# Patient Record
Sex: Male | Born: 1955 | Race: White | Hispanic: No | Smoking: Light tobacco smoker
Health system: Southern US, Community
[De-identification: ages and names within clinical notes are randomized; demographics above are authoritative.]

## PROBLEM LIST (undated history)

## (undated) DIAGNOSIS — F419 Anxiety disorder, unspecified: Secondary | ICD-10-CM

## (undated) DIAGNOSIS — E7800 Pure hypercholesterolemia, unspecified: Secondary | ICD-10-CM

## (undated) HISTORY — PX: TRIGGER FINGER RELEASE: SHX641

## (undated) HISTORY — PX: VASECTOMY: SHX75

## (undated) HISTORY — PX: SHOULDER SURGERY: SHX246

---

## 2013-02-01 ENCOUNTER — Emergency Department (HOSPITAL_COMMUNITY)
Admission: EM | Admit: 2013-02-01 | Discharge: 2013-02-01 | Disposition: A | Discharge status: Home / Self Care | Payer: 59 | Attending: Emergency Medicine | Admitting: Emergency Medicine

## 2013-02-01 ENCOUNTER — Emergency Department (HOSPITAL_COMMUNITY): Payer: 59

## 2013-02-01 ENCOUNTER — Encounter (HOSPITAL_COMMUNITY): Payer: 59 | Admitting: Anesthesiology

## 2013-02-01 ENCOUNTER — Encounter (HOSPITAL_COMMUNITY)
Admission: EM | Disposition: A | Discharge status: Home / Self Care | Payer: Self-pay | Source: Home / Self Care | Attending: Emergency Medicine

## 2013-02-01 ENCOUNTER — Encounter (HOSPITAL_COMMUNITY): Payer: Self-pay | Admitting: Emergency Medicine

## 2013-02-01 ENCOUNTER — Emergency Department (HOSPITAL_COMMUNITY): Payer: 59 | Admitting: Anesthesiology

## 2013-02-01 DIAGNOSIS — F17200 Nicotine dependence, unspecified, uncomplicated: Secondary | ICD-10-CM | POA: Insufficient documentation

## 2013-02-01 DIAGNOSIS — IMO0002 Reserved for concepts with insufficient information to code with codable children: Secondary | ICD-10-CM | POA: Insufficient documentation

## 2013-02-01 DIAGNOSIS — W230XXA Caught, crushed, jammed, or pinched between moving objects, initial encounter: Secondary | ICD-10-CM | POA: Insufficient documentation

## 2013-02-01 DIAGNOSIS — E7800 Pure hypercholesterolemia, unspecified: Secondary | ICD-10-CM | POA: Insufficient documentation

## 2013-02-01 DIAGNOSIS — S6710XA Crushing injury of unspecified finger(s), initial encounter: Secondary | ICD-10-CM | POA: Insufficient documentation

## 2013-02-01 HISTORY — PX: AMPUTATION: SHX166

## 2013-02-01 HISTORY — DX: Pure hypercholesterolemia, unspecified: E78.00

## 2013-02-01 LAB — BASIC METABOLIC PANEL
CO2: 24 mEq/L (ref 19–32)
Calcium: 9.4 mg/dL (ref 8.4–10.5)
Chloride: 103 mEq/L (ref 96–112)
Creatinine, Ser: 1.07 mg/dL (ref 0.50–1.35)
Glucose, Bld: 110 mg/dL — ABNORMAL HIGH (ref 70–99)

## 2013-02-01 LAB — CBC
HCT: 43.8 % (ref 39.0–52.0)
Hemoglobin: 16 g/dL (ref 13.0–17.0)
MCH: 32.7 pg (ref 26.0–34.0)
MCV: 89.4 fL (ref 78.0–100.0)
Platelets: 223 10*3/uL (ref 150–400)
RBC: 4.9 MIL/uL (ref 4.22–5.81)
WBC: 11.2 10*3/uL — ABNORMAL HIGH (ref 4.0–10.5)

## 2013-02-01 SURGERY — AMPUTATION DIGIT
Anesthesia: General | Site: Hand | Laterality: Left | Wound class: Dirty or Infected

## 2013-02-01 MED ORDER — HYDROMORPHONE HCL PF 1 MG/ML IJ SOLN
1.0000 mg | Freq: Once | INTRAMUSCULAR | Status: AC
  Administered 2013-02-01: 1 mg via INTRAVENOUS
  Filled 2013-02-01: qty 1

## 2013-02-01 MED ORDER — BUPIVACAINE HCL (PF) 0.25 % IJ SOLN
INTRAMUSCULAR | Status: AC
  Filled 2013-02-01: qty 10

## 2013-02-01 MED ORDER — OXYCODONE-ACETAMINOPHEN 5-325 MG PO TABS: ORAL_TABLET | ORAL | Status: AC

## 2013-02-01 MED ORDER — OXYCODONE HCL 5 MG PO TABS
ORAL_TABLET | ORAL | Status: AC
  Filled 2013-02-01: qty 1

## 2013-02-01 MED ORDER — SULFAMETHOXAZOLE-TRIMETHOPRIM 800-160 MG PO TABS: 1.0000 | ORAL_TABLET | Freq: Two times a day (BID) | ORAL | Status: AC

## 2013-02-01 MED ORDER — MIDAZOLAM HCL 2 MG/2ML IJ SOLN: 0.5000 mg | Freq: Once | INTRAMUSCULAR | Status: DC | PRN

## 2013-02-01 MED ORDER — OXYCODONE HCL 5 MG PO TABS
5.0000 mg | ORAL_TABLET | Freq: Once | ORAL | Status: AC | PRN
  Administered 2013-02-01: 5 mg via ORAL

## 2013-02-01 MED ORDER — SODIUM CHLORIDE 0.9 % IR SOLN
Status: DC | PRN
  Administered 2013-02-01: 3000 mL

## 2013-02-01 MED ORDER — HYDROMORPHONE HCL PF 1 MG/ML IJ SOLN
0.2500 mg | INTRAMUSCULAR | Status: DC | PRN
Start: 2013-02-01 — End: 2013-02-02

## 2013-02-01 MED ORDER — PROMETHAZINE HCL 25 MG/ML IJ SOLN: 6.2500 mg | INTRAMUSCULAR | Status: DC | PRN

## 2013-02-01 MED ORDER — LIDOCAINE HCL (CARDIAC) 20 MG/ML IV SOLN
INTRAVENOUS | Status: DC | PRN
  Administered 2013-02-01: 50 mg via INTRAVENOUS

## 2013-02-01 MED ORDER — ONDANSETRON HCL 4 MG/2ML IJ SOLN
INTRAMUSCULAR | Status: DC | PRN
  Administered 2013-02-01: 4 mg via INTRAVENOUS

## 2013-02-01 MED ORDER — LACTATED RINGERS IV SOLN
INTRAVENOUS | Status: DC | PRN
  Administered 2013-02-01 (×2): via INTRAVENOUS

## 2013-02-01 MED ORDER — BUPIVACAINE HCL 0.25 % IJ SOLN
INTRAMUSCULAR | Status: DC | PRN
  Administered 2013-02-01: 20 mL

## 2013-02-01 MED ORDER — ONDANSETRON HCL 4 MG/2ML IJ SOLN
4.0000 mg | Freq: Once | INTRAMUSCULAR | Status: AC
  Administered 2013-02-01: 4 mg via INTRAVENOUS
  Filled 2013-02-01: qty 2

## 2013-02-01 MED ORDER — FENTANYL CITRATE 0.05 MG/ML IJ SOLN
INTRAMUSCULAR | Status: DC | PRN
  Administered 2013-02-01 (×3): 50 ug via INTRAVENOUS
  Administered 2013-02-01: 150 ug via INTRAVENOUS
  Administered 2013-02-01 (×2): 50 ug via INTRAVENOUS

## 2013-02-01 MED ORDER — PROPOFOL 10 MG/ML IV BOLUS
INTRAVENOUS | Status: DC | PRN
  Administered 2013-02-01: 200 mg via INTRAVENOUS

## 2013-02-01 MED ORDER — SCOPOLAMINE 1 MG/3DAYS TD PT72
MEDICATED_PATCH | TRANSDERMAL | Status: AC
  Administered 2013-02-01: 1 via TRANSDERMAL
  Filled 2013-02-01: qty 1

## 2013-02-01 MED ORDER — OXYCODONE HCL 5 MG/5ML PO SOLN: 5.0000 mg | Freq: Once | ORAL | Status: AC | PRN

## 2013-02-01 MED ORDER — MEPERIDINE HCL 25 MG/ML IJ SOLN: 6.2500 mg | INTRAMUSCULAR | Status: DC | PRN

## 2013-02-01 MED ORDER — MIDAZOLAM HCL 5 MG/5ML IJ SOLN
INTRAMUSCULAR | Status: DC | PRN
  Administered 2013-02-01: 2 mg via INTRAVENOUS

## 2013-02-01 MED ORDER — TETANUS-DIPHTH-ACELL PERTUSSIS 5-2.5-18.5 LF-MCG/0.5 IM SUSP
0.5000 mL | Freq: Once | INTRAMUSCULAR | Status: AC
  Administered 2013-02-01: 0.5 mL via INTRAMUSCULAR
  Filled 2013-02-01: qty 0.5

## 2013-02-01 MED ORDER — CEFAZOLIN SODIUM 1-5 GM-% IV SOLN
INTRAVENOUS | Status: AC
  Filled 2013-02-01: qty 100

## 2013-02-01 MED ORDER — CEFAZOLIN SODIUM 1-5 GM-% IV SOLN
1.0000 g | Freq: Once | INTRAVENOUS | Status: AC
  Administered 2013-02-01: 1 g via INTRAVENOUS
  Filled 2013-02-01: qty 50

## 2013-02-01 MED ORDER — CEFAZOLIN SODIUM-DEXTROSE 2-3 GM-% IV SOLR
INTRAVENOUS | Status: DC | PRN
  Administered 2013-02-01: 2 g via INTRAVENOUS

## 2013-02-01 SURGICAL SUPPLY — 42 items
BANDAGE ELASTIC 3 VELCRO ST LF (GAUZE/BANDAGES/DRESSINGS) ×2 IMPLANT
BANDAGE GAUZE ELAST BULKY 4 IN (GAUZE/BANDAGES/DRESSINGS) ×2 IMPLANT
BLADE LONG MED 31X9 (MISCELLANEOUS) ×2 IMPLANT
BNDG COHESIVE 3X5 TAN STRL LF (GAUZE/BANDAGES/DRESSINGS) IMPLANT
BNDG ESMARK 4X9 LF (GAUZE/BANDAGES/DRESSINGS) ×2 IMPLANT
CLOTH BEACON ORANGE TIMEOUT ST (SAFETY) ×2 IMPLANT
CORDS BIPOLAR (ELECTRODE) ×2 IMPLANT
CUFF TOURNIQUET SINGLE 18IN (TOURNIQUET CUFF) ×2 IMPLANT
CUFF TOURNIQUET SINGLE 24IN (TOURNIQUET CUFF) IMPLANT
DRSG KUZMA FLUFF (GAUZE/BANDAGES/DRESSINGS) IMPLANT
DURAPREP 26ML APPLICATOR (WOUND CARE) ×2 IMPLANT
GAUZE XEROFORM 1X8 LF (GAUZE/BANDAGES/DRESSINGS) ×2 IMPLANT
GLOVE BIO SURGEON STRL SZ 6.5 (GLOVE) ×2 IMPLANT
GLOVE BIO SURGEON STRL SZ7.5 (GLOVE) ×2 IMPLANT
GLOVE SURG ORTHO 8.0 STRL STRW (GLOVE) ×2 IMPLANT
GOWN BRE IMP PREV XXLGXLNG (GOWN DISPOSABLE) IMPLANT
GOWN PREVENTION PLUS XLARGE (GOWN DISPOSABLE) ×2 IMPLANT
GOWN STRL NON-REIN LRG LVL3 (GOWN DISPOSABLE) ×2 IMPLANT
KIT BASIN OR (CUSTOM PROCEDURE TRAY) ×2 IMPLANT
KIT ROOM TURNOVER OR (KITS) ×2 IMPLANT
NEEDLE HYPO 25GX1X1/2 BEV (NEEDLE) ×2 IMPLANT
NS IRRIG 1000ML POUR BTL (IV SOLUTION) ×2 IMPLANT
PACK ORTHO EXTREMITY (CUSTOM PROCEDURE TRAY) ×2 IMPLANT
PAD ARMBOARD 7.5X6 YLW CONV (MISCELLANEOUS) ×4 IMPLANT
PAD CAST 3X4 CTTN HI CHSV (CAST SUPPLIES) ×1 IMPLANT
PAD CAST 4YDX4 CTTN HI CHSV (CAST SUPPLIES) IMPLANT
PADDING CAST COTTON 3X4 STRL (CAST SUPPLIES) ×1
PADDING CAST COTTON 4X4 STRL (CAST SUPPLIES)
RUBBERBAND STERILE (MISCELLANEOUS) IMPLANT
SPLINT PLASTER EXTRA FAST 3X15 (CAST SUPPLIES) ×1
SPLINT PLASTER GYPS XFAST 3X15 (CAST SUPPLIES) ×1 IMPLANT
SPONGE GAUZE 4X4 12PLY (GAUZE/BANDAGES/DRESSINGS) ×2 IMPLANT
SPONGE SCRUB IODOPHOR (GAUZE/BANDAGES/DRESSINGS) ×2 IMPLANT
SUT ETHILON 5 0 PS 2 18 (SUTURE) IMPLANT
SUT MON AB 5-0 P3 18 (SUTURE) ×2 IMPLANT
SUT MON AB 5-0 PS2 18 (SUTURE) ×4 IMPLANT
SUT SILK 4 0 PS 2 (SUTURE) IMPLANT
SUT VICRYL 4-0 PS2 18IN ABS (SUTURE) IMPLANT
SYR CONTROL 10ML LL (SYRINGE) ×2 IMPLANT
TOWEL OR 17X24 6PK STRL BLUE (TOWEL DISPOSABLE) ×2 IMPLANT
TOWEL OR 17X26 10 PK STRL BLUE (TOWEL DISPOSABLE) ×2 IMPLANT
UNDERPAD 30X30 INCONTINENT (UNDERPADS AND DIAPERS) ×2 IMPLANT
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
## 2013-02-01 NOTE — Transfer of Care (Signed)
Immediate Anesthesia Transfer of Care Note  Patient: [name] [name]  Procedure(s) Performed: Procedure(s): REVISION AMPUTATION SECOND, THIRD AND FOURTH, and SMALL DIGITS LEFT HAND (Left)  Patient Location: PACU  Anesthesia Type:General  Level of Consciousness: awake, alert  and oriented  Airway & Oxygen Therapy: Patient Spontanous Breathing and Patient connected to nasal cannula oxygen  Post-op Assessment: Report given to PACU RN, Post -op Vital signs reviewed and stable and Patient moving all extremities X 4  Post vital signs: Reviewed and stable  Complications: No apparent anesthesia complications

## 2013-02-01 NOTE — Discharge Instructions (Signed)
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
## 2013-02-01 NOTE — ED Notes (Signed)
Patient transported to X-ray 

## 2013-02-01 NOTE — Anesthesia Postprocedure Evaluation (Signed)
  Anesthesia Post-op Note  Patient: [name] [name]  Procedure(s) Performed: Procedure(s): REVISION AMPUTATION SECOND, THIRD AND FOURTH, and SMALL DIGITS LEFT HAND (Left)  Patient Location: PACU  Anesthesia Type:General  Level of Consciousness: awake, alert , oriented and patient cooperative  Airway and Oxygen Therapy: Patient Spontanous Breathing  Post-op Pain: none  Post-op Assessment: Post-op Vital signs reviewed, Patient's Cardiovascular Status Stable, Respiratory Function Stable, Patent Airway, No signs of Nausea or vomiting and Pain level controlled  Post-op Vital Signs: Reviewed and stable  Complications: No apparent anesthesia complications

## 2013-02-01 NOTE — Op Note (Signed)
[id number] 
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
## 2013-02-01 NOTE — Anesthesia Procedure Notes (Signed)
Procedure Name: LMA Insertion Date/Time: 02/01/2013 7:06 PM Performed by: Molli Hazard Pre-anesthesia Checklist: Patient identified, Emergency Drugs available, Suction available and Patient being monitored Patient Re-evaluated:Patient Re-evaluated prior to inductionOxygen Delivery Method: Circle system utilized Preoxygenation: Pre-oxygenation with 100% oxygen Intubation Type: IV induction LMA: LMA inserted LMA Size: 5.0 Number of attempts: 1 Placement Confirmation: positive ETCO2 and breath sounds checked- equal and bilateral Tube secured with: Tape Dental Injury: Teeth and Oropharynx as per pre-operative assessment

## 2013-02-01 NOTE — Preoperative (Signed)
Beta Blockers   Reason not to administer Beta Blockers:Not Applicable 
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
## 2013-02-01 NOTE — Brief Op Note (Signed)
02/01/2013  8:44 PM  PATIENT:  [name] [name]  57 y.o. male  PRE-OPERATIVE DIAGNOSIS:  amputation digits left hand  POST-OPERATIVE DIAGNOSIS:  amputation digits left hand  PROCEDURE:  Procedure(s): REVISION AMPUTATION SECOND, THIRD AND FOURTH, and SMALL DIGITS LEFT HAND (Left)  SURGEON:  Surgeon(s) and Role:    * Tami Ribas, MD - Primary  PHYSICIAN ASSISTANT:   ASSISTANTS: none   ANESTHESIA:   general  EBL:     BLOOD ADMINISTERED:none  DRAINS: none   LOCAL MEDICATIONS USED:  MARCAINE     SPECIMEN:  No Specimen  DISPOSITION OF SPECIMEN:  N/A  COUNTS:  YES  TOURNIQUET:   Total Tourniquet Time Documented: Upper Arm (Left) - 75 minutes Total: Upper Arm (Left) - 75 minutes   DICTATION: .Other Dictation: Dictation Number [id number]  PLAN OF CARE: Discharge to home after PACU  PATIENT DISPOSITION:  PACU - hemodynamically stable.

## 2013-02-02 NOTE — ED Provider Notes (Signed)
Medical screening examination/treatment/procedure(s) were performed by non-physician practitioner and as supervising physician I was immediately available for consultation/collaboration.  EKG Interpretation     Ventricular Rate:  89 PR Interval:  140 QRS Duration: 85 QT Interval:  342 QTC Calculation: 416 R Axis:   70 Text Interpretation:  Sinus rhythm No previous tracing              Gwyneth Sprout, MD 02/02/13 1101

## 2013-02-03 ENCOUNTER — Encounter (HOSPITAL_COMMUNITY): Payer: Self-pay | Admitting: Orthopedic Surgery
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
## 2019-02-28 ENCOUNTER — Other Ambulatory Visit: Payer: Self-pay

## 2019-02-28 ENCOUNTER — Encounter: Payer: Self-pay | Admitting: Emergency Medicine

## 2019-02-28 ENCOUNTER — Emergency Department
Admission: EM | Admit: 2019-02-28 | Discharge: 2019-02-28 | Disposition: A | Discharge status: Home / Self Care | Payer: Medicare Other | Source: Home / Self Care | Attending: Emergency Medicine | Admitting: Emergency Medicine

## 2019-02-28 DIAGNOSIS — Z23 Encounter for immunization: Secondary | ICD-10-CM

## 2019-02-28 DIAGNOSIS — S61217A Laceration without foreign body of left little finger without damage to nail, initial encounter: Secondary | ICD-10-CM

## 2019-02-28 HISTORY — DX: Anxiety disorder, unspecified: F41.9

## 2019-02-28 MED ORDER — TETANUS-DIPHTH-ACELL PERTUSSIS 5-2.5-18.5 LF-MCG/0.5 IM SUSP
0.5000 mL | Freq: Once | INTRAMUSCULAR | Status: AC
  Administered 2019-02-28: 14:00:00 0.5 mL via INTRAMUSCULAR

## 2019-02-28 NOTE — ED Provider Notes (Signed)
[name]    CSN: [id number] Arrival date & time: 02/28/19  1250      History   Chief Complaint Chief Complaint  Patient presents with  . Finger Injury    left #5    HPI [name] [name] is a 63 y.o. male.  Patient was in his shop working when the grinder started to fall.  He tried to catch it and when he did he suffered a laceration to his left fifth finger.  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 HPI  Past Medical History:  Diagnosis Date  . Anxiety   . Hypercholesteremia     There are no problems to display for this patient.   Past Surgical History:  Procedure Laterality Date  . AMPUTATION Left 02/01/2013   Procedure: REVISION AMPUTATION SECOND, THIRD AND FOURTH, and SMALL DIGITS LEFT HAND;  Surgeon: Tami Ribas, MD;  Location: [facility] OR;  Service: Orthopedics;  Laterality: Left;  . SHOULDER SURGERY    . TRIGGER FINGER RELEASE    . VASECTOMY         Home Medications    Prior to Admission medications   Medication Sig Start Date End Date Taking? Authorizing Provider  escitalopram (LEXAPRO) 10 MG tablet Take 10 mg by mouth daily.   Yes [provider]  albuterol (PROVENTIL HFA;VENTOLIN HFA) 108 (90 BASE) MCG/ACT inhaler Inhale 1 puff into the lungs every 6 (six) hours as needed for wheezing or shortness of breath.    [provider]  atorvastatin (LIPITOR) 10 MG tablet Take 10 mg by mouth daily.    [provider]  oxyCODONE-acetaminophen (PERCOCET) 5-325 MG per tablet 1-2 tabs po q6 hours prn pain 02/01/13   Betha Loa, MD  sulfamethoxazole-trimethoprim (BACTRIM DS) 800-160 MG per tablet Take 1 tablet by mouth 2 (two) times daily. 02/01/13   Betha Loa, MD    Family History No family history on file.  Social History Social History   Tobacco Use  . Smoking status: Light Tobacco Smoker  . Smokeless tobacco: Current User    Types: Snuff, Chew    Substance Use Topics  . Alcohol use: Yes  . Drug use: No     Allergies   Patient has no known allergies.   Review of Systems Review of Systems  Respiratory: Negative.   Cardiovascular: Negative.   Gastrointestinal: Negative.   Skin: Positive for wound.     Physical Exam Triage Vital Signs ED Triage Vitals  Enc Vitals Group     BP 02/28/19 1316 (!) 143/98     Pulse Rate 02/28/19 1316 91     Resp 02/28/19 1316 16     Temp 02/28/19 1316 98.4 F (36.9 C)     Temp Source 02/28/19 1316 Oral     SpO2 02/28/19 1316 97 %     Weight 02/28/19 1318 180 lb (81.6 kg)     Height 02/28/19 1318 5\' 7"  (1.702 m)     Head Circumference --      Peak Flow --      Pain Score 02/28/19 1317 3     Pain Loc --      Pain Edu? --      Excl. in GC? --    No data found.  Updated Vital Signs BP (!) 143/98 (BP Location: Right Arm)   Pulse 91   Temp 98.4 F (36.9 C) (Oral)   Resp 16  Ht 5\' 7"  (1.702 m)   Wt 81.6 kg   SpO2 97%   BMI 28.19 kg/m   Visual Acuity Right Eye Distance:   Left Eye Distance:   Bilateral Distance:    Right Eye Near:   Left Eye Near:    Bilateral Near:     Physical Exam Skin:    Comments: There is a 1 cm laceration over the proximal phalanx of the extensor surface of the left fifth finger.  Neurovascular is intact.  Sensation intact to the tip of the finger.  There is no pain with extension against resistance at the PIP or DIP joints    Procedure note After cleaning with saline and soap and water followed by Betadine cleaning the wound was cleaned with a gauze with saline.  1 cc of 2% plain was injected without difficulty.  Skin margins were approximated with number two 5-0 nylon sutures.  Patient tolerated procedure well.  UC Treatments / Results  Labs (all labs ordered are listed, but only abnormal results are displayed) Labs Reviewed - No data to display  EKG   Radiology No results found.  Procedures Procedures (including critical care  time)  Medications Ordered in UC Medications - No data to display  Initial Impression / Assessment and Plan / UC Course  I have reviewed the triage vital signs and the nursing notes. Tetanus was updated.  Patient had a superficial laceration to the base of the left fifth finger.  This was repaired without difficulty.  His tetanus was updated.  Suture removal 7 to 10 days. Pertinent labs & imaging results that were available during my care of the patient were reviewed by me and considered in my medical decision making (see chart for details).      Final Clinical Impressions(s) / UC Diagnoses   Final diagnoses:  Laceration of left Lykens finger, initial encounter     Discharge Instructions     Please return for suture removal in 7 to 10 days    ED Prescriptions    None     PDMP not reviewed this encounter.   Darlyne Russian, MD 02/28/19 [phone]

## 2019-02-28 NOTE — ED Triage Notes (Signed)
Patient just lacerated #5 finger of left hand with grinder at home; bandaid in place; had tdap 3-4 years ago.  No known exposure to covid positive person nor has he travelled.  He has not had influenza vacc this season.

## 2019-02-28 NOTE — Discharge Instructions (Addendum)
Please return for suture removal in 7 to 10 days.
